# Patient Record
Sex: Female | Born: 1960 | Race: Black or African American | Hispanic: No | Marital: Married
Health system: Southern US, Community
[De-identification: ages and names within clinical notes are randomized; demographics above are authoritative.]

## PROBLEM LIST (undated history)

## (undated) DIAGNOSIS — Z8481 Family history of carrier of genetic disease: Secondary | ICD-10-CM

## (undated) DIAGNOSIS — Z803 Family history of malignant neoplasm of breast: Secondary | ICD-10-CM

## (undated) DIAGNOSIS — Z8041 Family history of malignant neoplasm of ovary: Secondary | ICD-10-CM

## (undated) DIAGNOSIS — Z8042 Family history of malignant neoplasm of prostate: Secondary | ICD-10-CM

## (undated) HISTORY — DX: Family history of carrier of genetic disease: Z84.81

## (undated) HISTORY — DX: Family history of malignant neoplasm of prostate: Z80.42

## (undated) HISTORY — DX: Family history of malignant neoplasm of breast: Z80.3

## (undated) HISTORY — DX: Family history of malignant neoplasm of ovary: Z80.41

---

## 1998-11-03 ENCOUNTER — Other Ambulatory Visit: Admission: RE | Admit: 1998-11-03 | Discharge: 1998-11-03 | Payer: Self-pay | Admitting: Obstetrics

## 1999-02-01 ENCOUNTER — Ambulatory Visit (HOSPITAL_COMMUNITY): Admission: RE | Admit: 1999-02-01 | Discharge: 1999-02-01 | Payer: Self-pay | Admitting: Obstetrics

## 1999-02-01 ENCOUNTER — Encounter: Payer: Self-pay | Admitting: Obstetrics

## 1999-10-29 ENCOUNTER — Ambulatory Visit (HOSPITAL_COMMUNITY): Admission: RE | Admit: 1999-10-29 | Discharge: 1999-10-29 | Payer: Self-pay | Admitting: Emergency Medicine

## 1999-10-29 ENCOUNTER — Emergency Department (HOSPITAL_COMMUNITY): Admission: EM | Admit: 1999-10-29 | Discharge: 1999-10-29 | Payer: Self-pay | Admitting: Emergency Medicine

## 1999-11-22 ENCOUNTER — Encounter: Payer: Self-pay | Admitting: General Surgery

## 1999-11-26 ENCOUNTER — Ambulatory Visit (HOSPITAL_COMMUNITY): Admission: RE | Admit: 1999-11-26 | Discharge: 1999-11-27 | Payer: Self-pay | Admitting: General Surgery

## 1999-11-26 ENCOUNTER — Encounter: Payer: Self-pay | Admitting: General Surgery

## 1999-11-26 ENCOUNTER — Encounter (INDEPENDENT_AMBULATORY_CARE_PROVIDER_SITE_OTHER): Payer: Self-pay | Admitting: Specialist

## 2000-02-21 ENCOUNTER — Other Ambulatory Visit: Admission: RE | Admit: 2000-02-21 | Discharge: 2000-02-21 | Payer: Self-pay | Admitting: Obstetrics

## 2002-07-20 ENCOUNTER — Encounter: Payer: Self-pay | Admitting: Obstetrics

## 2002-07-20 ENCOUNTER — Ambulatory Visit (HOSPITAL_COMMUNITY): Admission: RE | Admit: 2002-07-20 | Discharge: 2002-07-20 | Payer: Self-pay | Admitting: Obstetrics

## 2003-02-24 ENCOUNTER — Ambulatory Visit (HOSPITAL_COMMUNITY): Admission: RE | Admit: 2003-02-24 | Discharge: 2003-02-24 | Payer: Self-pay | Admitting: Obstetrics

## 2003-10-15 ENCOUNTER — Emergency Department (HOSPITAL_COMMUNITY): Admission: EM | Admit: 2003-10-15 | Discharge: 2003-10-15 | Payer: Self-pay | Admitting: Emergency Medicine

## 2004-02-07 ENCOUNTER — Emergency Department (HOSPITAL_COMMUNITY): Admission: EM | Admit: 2004-02-07 | Discharge: 2004-02-07 | Payer: Self-pay | Admitting: Emergency Medicine

## 2004-02-20 ENCOUNTER — Encounter: Admission: RE | Admit: 2004-02-20 | Discharge: 2004-02-20 | Payer: Self-pay | Admitting: Orthopedic Surgery

## 2004-02-24 ENCOUNTER — Ambulatory Visit (HOSPITAL_COMMUNITY): Admission: RE | Admit: 2004-02-24 | Discharge: 2004-02-24 | Payer: Self-pay | Admitting: Obstetrics

## 2005-08-11 IMAGING — DX DG ORTHOPANTOGRAM /PANORAMIC
1 series · 1 of 1 positions shown · non-contrast
Comparison: none

CLINICAL DATA: Left temporomandibular joint pain and popping.  
 ORTHOPANTOGRAM:
 There is periapical lucency around the roots of tooth #30.  The patient does have evidence of prior root canals at that tooth.  
 The mandibular condyles appear normal bilaterally.

[view not recorded]
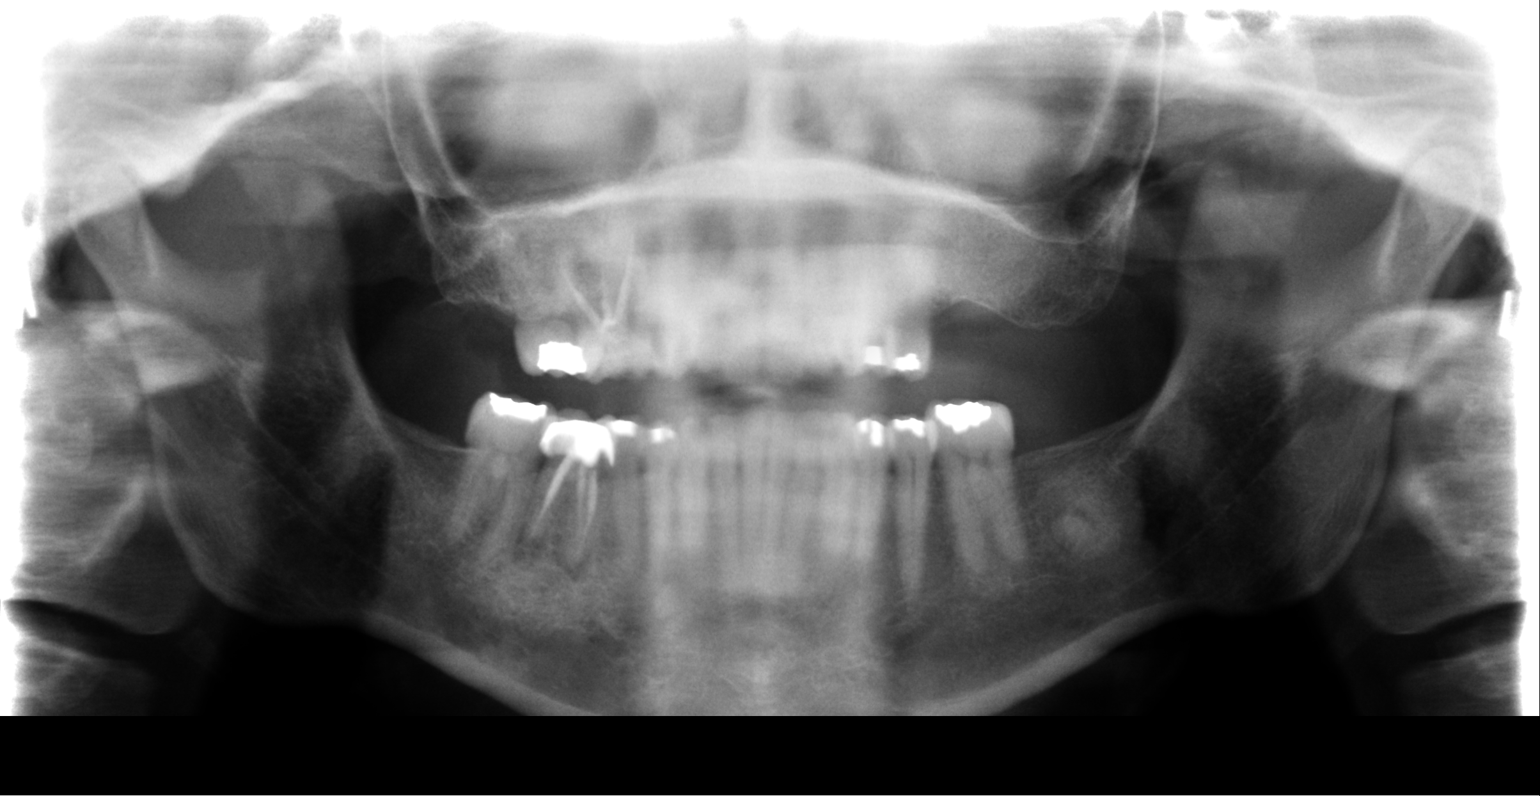

[1 of 1 positions shown; findings below may reference images not displayed]

IMPRESSION: Periapical lucencies around the roots of tooth #30.  Normal mandibular condyles.

## 2005-12-05 ENCOUNTER — Ambulatory Visit (HOSPITAL_COMMUNITY): Admission: RE | Admit: 2005-12-05 | Discharge: 2005-12-05 | Payer: Self-pay | Admitting: Obstetrics

## 2006-01-09 ENCOUNTER — Ambulatory Visit (HOSPITAL_COMMUNITY): Admission: RE | Admit: 2006-01-09 | Discharge: 2006-01-09 | Payer: Self-pay | Admitting: Obstetrics

## 2006-01-09 ENCOUNTER — Encounter (INDEPENDENT_AMBULATORY_CARE_PROVIDER_SITE_OTHER): Payer: Self-pay | Admitting: *Deleted

## 2007-04-22 ENCOUNTER — Emergency Department (HOSPITAL_COMMUNITY): Admission: EM | Admit: 2007-04-22 | Discharge: 2007-04-22 | Payer: Self-pay | Admitting: Emergency Medicine

## 2008-06-01 ENCOUNTER — Ambulatory Visit (HOSPITAL_COMMUNITY): Admission: RE | Admit: 2008-06-01 | Discharge: 2008-06-01 | Payer: Self-pay | Admitting: Family Medicine

## 2010-02-17 ENCOUNTER — Encounter: Payer: Self-pay | Admitting: Obstetrics

## 2010-06-15 NOTE — Op Note (Signed)
[name], Rebecca Ho                 ACCOUNT NO.:  [id number]   MEDICAL RECORD NO.:  [id number]          PATIENT TYPE:  AMB   LOCATION:  SDC                           FACILITY:  [facility]   PHYSICIAN:  Kathreen Cosier, M.D.DATE OF BIRTH:  Feb 06, 1960   DATE OF PROCEDURE:  01/09/2006  DATE OF DISCHARGE:                               OPERATIVE REPORT   PREOPERATIVE DIAGNOSIS:  Dysfunctional bleeding.   PROCEDURE:  Dilatation and curettage, hysteroscopy, and NovaSure  ablation.   Under general anesthesia, the patient in lithotomy position, the  perineum and vagina were prepped and draped, the bladder emptied with a  straight catheter.  A bimanual exam revealed the uterus to be normal in  size, negative adnexa.  A speculum placed in the vagina.  Cervix  injected with 10 mL of 1% Xylocaine.  Anterior lip of the cervix grasped  with a tenaculum and the cervix curetted and a small amount of tissue  obtained.  The endometrial cavity sounded to 10 cm.  Using a Hegar  dilator, the cervix was measured at 5 cm.  The cervix dilated with a #27  Shawnie Pons.  The hysteroscope was inserted and the cavity was noted to be  normal.  It was then removed and a sharp curettage performed and a  moderate amount of tissue obtained.  The NovaSure device was later  inserted and the cavity width was 4.5 cm.  The cavity ablation test was  passed and then the cavity was ablated at 124 watts for 1 minute 5  seconds.  Repeat hysteroscopy showed total ablation.  Fluid deficit 5  mL.  The patient tolerated the procedure well, taken to the recovery  room in good condition.           ______________________________  Kathreen Cosier, M.D.     BAM/MEDQ  D:  01/09/2006  T:  01/09/2006  Job:  [id number]

## 2010-06-15 NOTE — Op Note (Signed)
NAMEANIA, LEVAY                           ACCOUNT NO.:  [id number]   MEDICAL RECORD NO.:  [id number]                   PATIENT TYPE:  AMB   LOCATION:  SDC                                  FACILITY:  [facility]   PHYSICIAN:  Kathreen Cosier, M.D.           DATE OF BIRTH:  [birth year]   DATE OF PROCEDURE:  02/24/2003  DATE OF DISCHARGE:                                 OPERATIVE REPORT   PREOPERATIVE DIAGNOSIS:  Multiparity.   PROCEDURE:  Open laparoscopic tubal sterilization.   Under general anesthesia, patient in lithotomy position.  The abdomen,  perineum, and vagina were prepped and draped.  Bladder emptied with a  straight catheter.  A weighted speculum placed in the vagina.  A Hulka  tenaculum placed on the cervix.  In the umbilicus a transverse incision was  made and carried down to the fascia.  The fascia was cleaned.  Fascia  grasped with two Kochers and the fascia of the peritoneum opened with the  Mayo scissors.  The sleeve of the trocar inserted intraperitoneally.  Carbon  dioxide 3 L infused intraperitoneally.  The visual laparoscope inserted.  Uterus, tubes, and ovaries were normal.  The right tube was grasped 1 cm  from the cornu and cauterized.  The tube was cauterized in a total of four  places, moving lateral from the first site of cautery.  The procedure was  done in the exact fashion on the other side.  The probe was removed, the CO2  allowed to escape from the peritoneal cavity, and the fascia closed with one  stitch of 0 Dexon and the skin closed with subcuticular stitch of 3-0  Monocryl.  The patient tolerated the procedure well, was taken to the  recovery room in good condition.                                               Kathreen Cosier, M.D.    BAM/MEDQ  D:  02/24/2003  T:  02/24/2003  Job:  [id number]

## 2010-06-15 NOTE — Op Note (Signed)
[facility]  Patient:    Rebecca Ho, Rebecca Ho                          MRN: [id number] Adm. Date:  [id number] Attending:  Arlis Porta CC:         Kern Reap, M.D.   Operative Report  PREOPERATIVE DIAGNOSIS:       Chronic calculus, cholecystitis.  POSTOPERATIVE DIAGNOSIS:      Subacute calculus, cholecystitis.  PROCEDURE:                    Laparoscopic cholecystectomy with intraoperative cholangiogram.  SURGEON:                      Adolph Pollack, M.D.  ASSISTANT:                    Velora Heckler, M.D.  ANESTHESIA:                   General.  INDICATION:                   Ms. Cangemi is a 50 year old female who had a significant bout of right upper quadrant pain with nausea and vomiting.  She went to the emergency department where she improved, but was noticed to have a large gallstone in the gallbladder neck.  She did not have any jaundice. There is a significant family history of gallbladder disease.  Since that time she has been relatively asymptomatic.  She has watched her diet.  She now presents for elective cholecystectomy.  TECHNIQUE:                    She was placed upon the operating table and general anesthetic was administered.  Her abdomen was sterilely prepped and draped.  Local anesthetic consisting of plain Marcaine was infiltrated in the subumbilical region and longitudinal incision was made and carried down to the fascia where a 1.5 cm incision was made in the fascia.  A purse string suture of 0 Vicryl was placed around the fascial edges.  The peritoneal cavity was entered under direct vision.  A Hasson Trocar was introduced into the peritoneal cavity and pneumoperitoneum created by insufflation of CO2 gas. Next the laparoscope was introduced and the gallbladder was visualized and was a reddish color looking like it had maybe some acute and chronic inflammation. There were thin adhesions between the omentum and  duodenum in the gallbladder.                                An 11 mm Trocar was placed through a similar sized epigastric incision into the abdominal cavity and two 5 mm Trocars were placed in the right upper quadrant through similar sized incisions into the right upper quadrant.  The gallbladder was fairly tense and we had to decompress it and there appeared to be purulent material that was decompressed.  The fundus was then grasped and retracted toward the right shoulder.  I then carefully swept away the adhesions between the omentum, duodenum, and the gallbladder.  There was a very large stone in the neck of the gallbladder.  It made it difficult to identify the infundibulum.  I subsequently freed up the infundibulum leaving blunt dissection and the cautery.  Then identified a short cystic duct and traced  it all the way up to the gallbladder area and then skeletonized it.  I placed a clip across the gallbladder cystic duct junction and then made an incision into the cystic duct.  The ______ catheter was passed through the abdominal wall and placed into the cystic duct.  A cholangiogram was then performed.                                Under real time fluoroscopy contrast was injected into the cystic duct.  The common bile duct quickly opacified and contrast drained quickly into the duodenum.  The hepatic and right and left hepatic ducts were well seen.  There did not appear to be any obvious obstructions to the flow of contrast.  Final report is pending per the radiologist.                                The ______ catheter was removed.  The cystic duct was then clipped three times proximally and divided.  Cystic artery was identified, clipped, and divided.  The gallbladder was dissected free from the liver bed.  It was fairly indurated at this point.  There was also a lobulation of the liver that was adherent to the body of the gallbladder and this was included with the gallbladder  specimen.  Once the gallbladder was removed the liver bed was inspected and irrigated.  Bleeding points were controlled with the cautery.  No evidence of further bleeding or bile leakage were noted.                                The gallbladder was placed in an ______ bag and removed through the subumbilical port.  The subumbilical fascial incision was then closed by tightening up and tying down the purse string suture.  The perihepatic area was irrigated and once again no bleeding or bile leaking was noted.                                The remaining Trocars were removed and a pneumoperitoneum released.  The skin incisions were closed with 4-0 Monocryl subcuticular stitches followed by Steri-Strips and sterile dressings.                                She tolerated the procedure well without any apparent complications and was taken to the recovery room in satisfactory condition. DD:  11/26/99 TD:  11/26/99 Job: [id number]

## 2015-02-24 ENCOUNTER — Other Ambulatory Visit: Payer: Self-pay | Admitting: Family Medicine

## 2015-02-24 DIAGNOSIS — Z1231 Encounter for screening mammogram for malignant neoplasm of breast: Secondary | ICD-10-CM

## 2016-09-18 ENCOUNTER — Other Ambulatory Visit: Payer: Self-pay | Admitting: Family Medicine

## 2016-09-18 DIAGNOSIS — Z1231 Encounter for screening mammogram for malignant neoplasm of breast: Secondary | ICD-10-CM

## 2017-10-03 ENCOUNTER — Inpatient Hospital Stay: Payer: Managed Care, Other (non HMO) | Attending: Genetic Counselor | Admitting: Licensed Clinical Social Worker

## 2017-10-03 ENCOUNTER — Encounter: Payer: Self-pay | Admitting: Licensed Clinical Social Worker

## 2017-10-03 ENCOUNTER — Inpatient Hospital Stay: Payer: Managed Care, Other (non HMO)

## 2017-10-03 DIAGNOSIS — Z803 Family history of malignant neoplasm of breast: Secondary | ICD-10-CM

## 2017-10-03 DIAGNOSIS — Z8481 Family history of carrier of genetic disease: Secondary | ICD-10-CM

## 2017-10-03 DIAGNOSIS — Z8041 Family history of malignant neoplasm of ovary: Secondary | ICD-10-CM

## 2017-10-03 DIAGNOSIS — Z8042 Family history of malignant neoplasm of prostate: Secondary | ICD-10-CM

## 2017-10-03 NOTE — Progress Notes (Signed)
REFERRING PROVIDER: Self  PRIMARY PROVIDER:  Helane Rima, MD  PRIMARY REASON FOR VISIT:  1. Family history of BRCA2 gene positive   2. Family history of breast cancer   3. Family history of ovarian cancer   4. Family history of prostate cancer     HISTORY OF PRESENT ILLNESS:   Rebecca Ho, a self-referred 57 y.o. female, was seen for a [facility] cancer genetics consultation due to her sister's BRCA2+ test result.  Rebecca Ho presents to clinic today to discuss the possibility of a hereditary predisposition to cancer, genetic testing, and to further clarify her future cancer risks, as well as potential cancer risks for family members.   Rebecca Ho is a 57 y.o. female with no personal history of cancer.    HORMONAL RISK FACTORS:  Menarche was at age 14.  First live birth at age 61.  Ovaries intact: yes.  Hysterectomy: no.  Menopausal status: postmenopausal.  Colonoscopy: yes; normal. Mammogram within the last year: no. Number of breast biopsies: 0.  Past Medical History:  Diagnosis Date  . Family history of BRCA2 gene positive   . Family history of breast cancer   . Family history of ovarian cancer   . Family history of prostate cancer     Social History   Socioeconomic History  . Marital status: Married    Spouse name: Not on file  . Number of children: Not on file  . Years of education: Not on file  . Highest education level: Not on file  Occupational History  . Not on file  Social Needs  . Financial resource strain: Not on file  . Food insecurity:    Worry: Not on file    Inability: Not on file  . Transportation needs:    Medical: Not on file    Non-medical: Not on file  Tobacco Use  . Smoking status: Not on file  Substance and Sexual Activity  . Alcohol use: Not on file  . Drug use: Not on file  . Sexual activity: Not on file  Lifestyle  . Physical activity:    Days per week: Not on file    Minutes per session: Not on file  . Stress: Not on file   Relationships  . Social connections:    Talks on phone: Not on file    Gets together: Not on file    Attends religious service: Not on file    Active member of club or organization: Not on file    Attends meetings of clubs or organizations: Not on file    Relationship status: Not on file  Other Topics Concern  . Not on file  Social History Narrative  . Not on file     FAMILY HISTORY:  We obtained a detailed, 4-generation family history.  Significant diagnoses are listed below: Family History  Problem Relation Age of Onset  . Aneurysm Mother   . Ovarian cancer Sister 66       BRCA2+  . Breast cancer Maternal Aunt        dx 34s  . Aneurysm Maternal Grandmother   . Prostate cancer Maternal Grandfather        dx 72s  . Aneurysm Sister 19  . Cancer Maternal Aunt        possibly cervical, possibly breast, died at 39  . Prostate cancer Maternal Uncle 23  . Ovarian cancer Cousin        maternal cousin, dx 19s, in [place]   Ms.  Ho has a daughter, 23, and a son, 63. She has three sisters. One died of an aneurysm at 29 and has four children. Another sister is 28 with three children. Her sister Rebecca Ho was recently diagnosed with ovarian cancer and had genetic testing which identified a BRCA2 mutation. Rebecca Ho has three children, two daughters and a son.  Rebecca Ho father was adopted, she has no information about his family history. He died at 27.  Rebecca Ho mother died of an aneurysm at 1. She had two brothers and two sisters. One of Rebecca Ho's aunts had breast cancer and died in her 64s. The other aunt had either cervical or breast cancer and died around 97. Her daughter Rebecca Ho has ovarian cancer, recently diagnosed in her 39s. One of Rebecca Ho's uncles had prostate cancer diagnosed in his 70's and died in his 89's. Rebecca Ho maternal grandfather also had prostate cancer, diagnosed in his 70's and died in his 51's. Rebecca Ho grandmother died of an aneurysm at unknown  age.  Rebecca Ho is aware of previous family history of genetic testing for hereditary cancer risks. Patient's maternal ancestors are of [place] American and Native American descent, and paternal ancestors are of African American descent. There is no reported Ashkenazi Jewish ancestry. There is no known consanguinity.   GENETIC COUNSELING ASSESSMENT: Rebecca Ho is a 57 y.o. female with a family history of Hereditary Breast and Ovarian Cancer. We, therefore, discussed and recommended the following at today's visit.   DISCUSSION: We discussed that about 5-10% of breast cancer cases are hereditary with most cases due to BRCA mutations. We discussed BRCA2 in detail, given her sister's result. Other genes associated with hereditary breast cancer cases include ATM, CHEK2 and PALB2.  We reviewed the characteristics, features and inheritance patterns of hereditary cancer syndromes. We also discussed genetic testing, including the appropriate family members to test, the process of testing, insurance coverage and turn-around-time for results. We discussed the implications of a negative, positive and/or variant of uncertain significant result. We recommended Rebecca Ho pursue genetic testing for the Common Hereditary Cancers Panel.  The Common Hereditary Cancers Panel offered by [facility] includes sequencing and/or deletion duplication testing of the following 47 genes: APC, ATM, AXIN2, BARD1, BMPR1A, BRCA1, BRCA2, BRIP1, CDH1, CDKN2A (p14ARF), CDKN2A (p16INK4a), CKD4, CHEK2, CTNNA1, DICER1, EPCAM (Deletion/duplication testing only), GREM1 (promoter region deletion/duplication testing only), KIT, MEN1, MLH1, MSH2, MSH3, MSH6, MUTYH, NBN, NF1, NHTL1, PALB2, PDGFRA, PMS2, POLD1, POLE, PTEN, RAD50, RAD51C, RAD51D, SDHB, SDHC, SDHD, SMAD4, SMARCA4. STK11, TP53, TSC1, TSC2, and VHL.  The following genes were evaluated for sequence changes only: SDHA and HOXB13 c.251G>A variant only.  We discussed that if she is found to  have a mutation in one of these genes, it may impact future medical management recommendations such as increased cancer screenings and consideration of risk reducing surgeries.  A positive result could also have implications for the patient's family members.  A Negative result would mean we were unable to identify the BRCA2 mutation found in her family. While reassuring this does not rule out the possibility of a mutation. There could be mutations that are undetectable by current technology, or in genes not yet tested or identified to increase cancer risk.    We discussed the potential to find a Variant of Uncertain Significance or VUS.  These are variants that have not yet been identified as pathogenic or benign, and it is unknown if this variant is associated with increased cancer risk or if this  is a normal finding.  Most VUS's are reclassified to benign or likely benign.   It should not be used to make medical management decisions. With time, we suspect the lab will determine the significance of any VUS's identified if any.   Based on Rebecca Ho's family history of BRCA2+, she meets medical criteria for genetic testing. Despite that she meets criteria, she may still have an out of pocket cost. The lab will provide her with this cost.   PLAN: After considering the risks, benefits, and limitations, Rebecca Ho  provided informed consent to pursue genetic testing and the blood sample was sent to [facility] for analysis of the Common Hereditary Cancers Panel. Results should be available within approximately 2-3 weeks' time, at which point they will be disclosed by telephone to Rebecca Ho, as will any additional recommendations warranted by these results. Ms. Seidner will receive a summary of her genetic counseling visit and a copy of her results once available. This information will also be available in Epic.  Based on Ms. Ventress's family history, we recommended her other sister have genetic  counseling and testing. Ms. Plaia reported that her sister does not have health insurance. We let her know that the lab has patient assistance programs that could help with this. Ms. Allaire will let us know if we can be of any assistance in coordinating genetic counseling and/or testing for this family member. Ms. Foucher niece, Dolphus Jenny, is scheduled to see me next week.   Lastly, we encouraged Ms. Ortman to remain in contact with cancer genetics annually so that we can continuously update the family history and inform her of any changes in cancer genetics and testing that may be of benefit for this family.   Ms.  Coke questions were answered to her satisfaction today. Our contact information was provided should additional questions or concerns arise. Thank you for the referral and allowing [place] to share in the care of your patient.   Epimenio Foot, MS Genetic Counselor [email or web address] Phone: [phone]  The patient was seen for a total of 50 minutes in face-to-face genetic counseling.This patient was discussed with Drs. Magrinat, Lindi Adie and/or Burr Medico who agrees with the above.

## 2017-10-21 ENCOUNTER — Telehealth: Payer: Self-pay | Admitting: Licensed Clinical Social Worker

## 2017-10-21 ENCOUNTER — Encounter: Payer: Self-pay | Admitting: Licensed Clinical Social Worker

## 2017-10-21 DIAGNOSIS — Z1379 Encounter for other screening for genetic and chromosomal anomalies: Secondary | ICD-10-CM | POA: Insufficient documentation

## 2017-10-27 ENCOUNTER — Telehealth: Payer: Self-pay | Admitting: Licensed Clinical Social Worker

## 2017-10-28 ENCOUNTER — Encounter: Payer: Self-pay | Admitting: Licensed Clinical Social Worker

## 2017-10-28 ENCOUNTER — Ambulatory Visit: Payer: Self-pay | Admitting: Licensed Clinical Social Worker

## 2017-10-28 DIAGNOSIS — Z803 Family history of malignant neoplasm of breast: Secondary | ICD-10-CM

## 2017-10-28 DIAGNOSIS — Z8481 Family history of carrier of genetic disease: Secondary | ICD-10-CM

## 2017-10-28 DIAGNOSIS — Z8041 Family history of malignant neoplasm of ovary: Secondary | ICD-10-CM

## 2017-10-28 DIAGNOSIS — Z1379 Encounter for other screening for genetic and chromosomal anomalies: Secondary | ICD-10-CM

## 2017-10-28 DIAGNOSIS — Z8042 Family history of malignant neoplasm of prostate: Secondary | ICD-10-CM

## 2017-10-28 NOTE — Telephone Encounter (Signed)
Revealed negative genetic testing for the familial BRCA2 mutation.  Revealed that 3 VUS were identified. This normal result is very reassuring;   it is unlikely that there is an increased risk of cancer due to a mutation in one of these genes.  However, genetic testing is not perfect, and cannot definitively rule out a hereditary cause.  It will be important for her to keep in contact with genetics to learn if any additional testing may be needed in the future. She informed me that her niece would be rescheduling to see me soon for her genetic counseling/testing.

## 2017-10-28 NOTE — Progress Notes (Signed)
HPI:  Rebecca Ho was previously seen in the [place] clinic on 10/03/2017 due to her sister's recent genetic testing which revealed a BRCA2 mutation. Please refer to our prior cancer genetics clinic note for more information regarding Rebecca Ho's medical, social and Ho histories, and our assessment and recommendations, at the time. Rebecca Ho recent genetic test results were disclosed to her, as well as recommendations warranted by these results. These results and recommendations are discussed in more detail below.   Ho HISTORY:  We obtained a detailed, 4-generation Ho history.  Significant diagnoses are listed below: Ho History  Problem Relation Age of Onset  . Aneurysm Mother   . Ovarian cancer Sister 40       BRCA2+  . Breast cancer Maternal Ho        dx 41s  . Aneurysm Maternal Grandmother   . Prostate cancer Maternal Grandfather        dx 7s  . Aneurysm Sister 55  . Cancer Maternal Ho        possibly cervical, possibly breast, died at 69  . Prostate cancer Maternal Uncle 63  . Ovarian cancer Cousin        maternal cousin, dx 29s, in [place]   Rebecca Ho has a daughter, 42, and a son, 43. She has three sisters. One died of an aneurysm at 51 and has four children. Another sister is 77 with three children. Her sister Rebecca Ho was recently diagnosed with ovarian cancer and had genetic testing which identified a BRCA2 mutation. Rebecca Ho has three children, two daughters and a son.  Rebecca Ho father was adopted, she has no information about his Ho history. He died at 12.  Rebecca Ho mother died of an aneurysm at 71. She had two brothers and two sisters. One of Rebecca Ho's aunts had breast cancer and died in her 70s. The other Ho had either cervical or breast cancer and died around 30. Her daughter Rebecca Ho has ovarian cancer, recently diagnosed in her 57s. One of Rebecca Ho's uncles had prostate cancer diagnosed in his 48's and died in his  40's. Rebecca Ho maternal grandfather also had prostate cancer, diagnosed in his 63's and died in his 39's. Rebecca Ho grandmother died of an aneurysm at unknown age.  Rebecca Ho is aware of previous Ho history of genetic testing for hereditary cancer risks. Patient's maternal ancestors are of [place] American and Native American descent, and paternal ancestors are of African American descent. There is no reported Ashkenazi Jewish ancestry. There is no known consanguinity.  GENETIC TEST RESULTS: Genetic testing performed through [facility]'s Common Hereditary Cancers Panel reported out on 10/20/2017 showed no pathogenic mutations.   The Common Hereditary Cancers Panel offered by [facility] includes sequencing and/or deletion duplication testing of the following 47 genes: APC, ATM, AXIN2, BARD1, BMPR1A, BRCA1, BRCA2, BRIP1, CDH1, CDKN2A (p14ARF), CDKN2A (p16INK4a), CKD4, CHEK2, CTNNA1, DICER1, EPCAM (Deletion/duplication testing only), GREM1 (promoter region deletion/duplication testing only), KIT, MEN1, MLH1, MSH2, MSH3, MSH6, MUTYH, NBN, NF1, NHTL1, PALB2, PDGFRA, PMS2, POLD1, POLE, PTEN, RAD50, RAD51C, RAD51D, SDHB, SDHC, SDHD, SMAD4, SMARCA4. STK11, TP53, TSC1, TSC2, and VHL.  The following genes were evaluated for sequence changes only: SDHA and HOXB13 c.251G>A variant only.  A variant of uncertain significance (VUS) in a gene called KIT c.1594G>A was also noted.  A variant of uncertain significance (VUS) in a gene called PALB2 c.1655A>G was also noted.  A variant of uncertain significance (VUS) in a gene called PMS2 c.591C>T was also  noted.   The test report will be scanned into EPIC and will be located under the Molecular Pathology section of the Results Review tab. A portion of the result report is included below for reference.      We recommended Rebecca Ho pursue testing for the familial hereditary cancer gene mutation called BRCA2 [phone]. Rebecca Ho test was normal and did not  reveal the familial mutation. We call this result a true negative result because the cancer-causing mutation was identified in Rebecca Ho, and she did not inherit it.  Given this negative result, Rebecca Ho's chances of developing BRCA2-related cancers are the same as they are in the general population.     Regarding the VUS's in KIT, PALB2, and PMS2: At this time, it is unknown if these variants are associated with increased cancer risk or if they are normal findings, but most variants such as these get reclassified to being inconsequential. They should not be used to make medical management decisions. With time, we suspect the lab will determine the significance of these variants, if any. If we do learn more about them, we will try to contact Rebecca Ho to discuss it further. However, it is important to stay in touch with [place] periodically and keep the address and phone number up to date.  ADDITIONAL GENETIC TESTING: While Rebecca Ho has had fairly extensive genetic testing, we discussed that there are other genes that are associated with increased cancer risk that can be analyzed. The laboratories that offer this testing look at these additional genes via a hereditary cancer gene panel. Should Rebecca Ho wish to pursue additional genetic testing, we are happy to discuss and coordinate this testing, at any time.    CANCER SCREENING RECOMMENDATIONS:  Rebecca Ho test result is considered negative (normal).  This means that we have not identified the familial BRCA2 mutation in her.   This normal indicates that it is unlikely Rebecca Ho has an increased risk of cancer due to a mutation in one of these genes.  Therefore, Rebecca Ho was advised to continue following the cancer screening guidelines provided by her primary healthcare providers. Other factors such as her personal and Ho history may still affect her cancer risk.    RECOMMENDATIONS FOR Ho MEMBERS:  Relatives in this Ho might  be at some increased risk of developing cancer, over the general population risk, simply due to the Ho history of cancer.  We recommended women in this Ho have a yearly mammogram beginning at age 50, or 76 years younger than the earliest onset of cancer, an annual clinical breast exam, and perform monthly breast self-exams. Women in this Ho should also have a gynecological exam as recommended by their primary provider. All Ho members should have a colonoscopy by age 21 (or as directed by their doctors).  All Ho members should inform their physicians about the Ho history of cancer so their doctors can make the most appropriate screening recommendations for them.   We recommended her other sister, nieces and nephews of her deceased sister, and her cousins have genetic counseling and testing. Ms. Harbison will let us know if we can be of any assistance in coordinating genetic counseling and/or testing for these Ho members.   FOLLOW-UP: Lastly, we discussed with Ms. Ellingwood that cancer genetics is a rapidly advancing field and it is possible that new genetic tests will be appropriate for her and/or her Ho members in the future. We encouraged her to remain in contact with  cancer genetics on an annual basis so we can update her personal and Ho histories and let her know of advances in cancer genetics that may benefit this Ho.   Our contact number was provided. Ms. Dark questions were answered to her satisfaction, and she knows she is welcome to call us at anytime with additional questions or concerns.   Epimenio Foot, MS Genetic Counselor [email or web address] Phone: [phone]

## 2017-11-20 NOTE — Telephone Encounter (Signed)
Error

## 2019-12-31 ENCOUNTER — Ambulatory Visit: Payer: Managed Care, Other (non HMO) | Attending: Internal Medicine

## 2019-12-31 DIAGNOSIS — Z23 Encounter for immunization: Secondary | ICD-10-CM

## 2019-12-31 NOTE — Progress Notes (Signed)
   Covid-19 Vaccination Clinic  Name:  Rebecca Ho    MRN: [id number] DOB: [birth year]  12/31/2019  [name] was observed post Covid-19 immunization for 15 minutes without incident. She was provided with Vaccine Information Sheet and instruction to access the V-Safe system.   [name] was instructed to call 911 with any severe reactions post vaccine: Marland Kitchen Difficulty breathing  . Swelling of face and throat  . A fast heartbeat  . A bad rash all over body  . Dizziness and weakness   Immunizations Administered    Name Date Dose VIS Date Route   Pfizer COVID-19 Vaccine 12/31/2019  5:18 PM 0.3 mL 11/17/2019 Intramuscular   Manufacturer: [employer], [employer]   Lot: [id number]   NDC: [address]-7

## 2021-10-18 ENCOUNTER — Other Ambulatory Visit: Payer: Self-pay

## 2021-10-18 ENCOUNTER — Emergency Department (HOSPITAL_COMMUNITY): Payer: Managed Care, Other (non HMO)

## 2021-10-18 ENCOUNTER — Emergency Department (HOSPITAL_COMMUNITY)
Admission: EM | Admit: 2021-10-18 | Discharge: 2021-10-18 | Disposition: A | Discharge status: Home / Self Care | Payer: No Typology Code available for payment source | Attending: Emergency Medicine | Admitting: Emergency Medicine

## 2021-10-18 ENCOUNTER — Encounter (HOSPITAL_COMMUNITY): Payer: Self-pay

## 2021-10-18 DIAGNOSIS — S0990XA Unspecified injury of head, initial encounter: Secondary | ICD-10-CM | POA: Diagnosis present

## 2021-10-18 DIAGNOSIS — W01198A Fall on same level from slipping, tripping and stumbling with subsequent striking against other object, initial encounter: Secondary | ICD-10-CM | POA: Diagnosis not present

## 2021-10-18 DIAGNOSIS — W19XXXA Unspecified fall, initial encounter: Secondary | ICD-10-CM

## 2021-10-18 DIAGNOSIS — Y990 Civilian activity done for income or pay: Secondary | ICD-10-CM | POA: Diagnosis not present

## 2021-10-18 MED ORDER — IBUPROFEN 200 MG PO TABS
600.0000 mg | ORAL_TABLET | Freq: Once | ORAL | Status: AC
  Administered 2021-10-18: 600 mg via ORAL
  Filled 2021-10-18: qty 3

## 2021-10-18 MED ORDER — IBUPROFEN 200 MG PO TABS
ORAL_TABLET | ORAL | Status: AC
  Filled 2021-10-18: qty 1

## 2021-10-18 MED ORDER — METHOCARBAMOL 500 MG PO TABS: 500.0000 mg | ORAL_TABLET | Freq: Two times a day (BID) | ORAL | 0 refills | Status: AC

## 2021-10-18 NOTE — ED Provider Notes (Signed)
[facility] DEPT Provider Note   CSN: [id number] Arrival date & time: 10/18/21  1402     History  Chief Complaint  Patient presents with   Head Injury   Rebecca Ho is a 61 y.o. female.  61 year old female presents today for evaluation of head injury that occurred around noon today while patient was at work.  Patient states she was sitting on a chair when the chair gave out underneath her and she hit the back of her head on the edge of a desk.  Denies loss of consciousness.  Is not on anticoagulation.  Denies any other injury.  Has been ambulatory since the time of the incident.   The history is provided by the patient. No language interpreter was used.       Home Medications Prior to Admission medications   Not on File      Allergies    Patient has no known allergies.    Review of Systems   Review of Systems  Constitutional:  Negative for chills and fever.  Respiratory:  Negative for shortness of breath.   Cardiovascular:  Negative for chest pain and palpitations.  Neurological:  Positive for headaches. Negative for syncope and weakness.  All other systems reviewed and are negative.   Physical Exam Updated Vital Signs BP (!) 160/100 (BP Location: Left Arm)   Pulse 79   Temp 98 F (36.7 C) (Oral)   Resp 16   SpO2 99%  Physical Exam Vitals and nursing note reviewed.  Constitutional:      General: She is not in acute distress.    Appearance: Normal appearance. She is not ill-appearing.  HENT:     Head: Normocephalic and atraumatic.     Nose: Nose normal.  Eyes:     Conjunctiva/sclera: Conjunctivae normal.  Pulmonary:     Effort: Pulmonary effort is normal. No respiratory distress.  Musculoskeletal:        General: No deformity.     Comments: Full range of motion bilateral upper and lower extremities.  All major joints without tenderness to palpation.  Ambulatory without difficulty.  Cervical, thoracic and lumbar  spine without tenderness to palpation.  Skin:    Findings: No rash.  Neurological:     Mental Status: She is alert.     ED Results / Procedures / Treatments   Labs (all labs ordered are listed, but only abnormal results are displayed) Labs Reviewed - No data to display  EKG None  Radiology CT Head Wo Contrast  Result Date: 10/18/2021 CLINICAL DATA:  Head and neck trauma, fall at work EXAM: CT HEAD WITHOUT CONTRAST CT CERVICAL SPINE WITHOUT CONTRAST TECHNIQUE: Multidetector CT imaging of the head and cervical spine was performed following the standard protocol without intravenous contrast. Multiplanar CT image reconstructions of the cervical spine were also generated. RADIATION DOSE REDUCTION: This exam was performed according to the departmental dose-optimization program which includes automated exposure control, adjustment of the mA and/or kV according to patient size and/or use of iterative reconstruction technique. COMPARISON:  None Available. FINDINGS: CT HEAD FINDINGS Brain: No evidence of acute infarction, hemorrhage, hydrocephalus, extra-axial collection or mass lesion/mass effect. Vascular: No hyperdense vessel or unexpected calcification. Skull: Hyperostosis.  Negative for fracture or focal lesion. Sinuses/Orbits: No acute finding. Other: None. CT CERVICAL SPINE FINDINGS Alignment: Straightening of the cervical spine. Skull base and vertebrae: No acute fracture. No primary bone lesion or focal pathologic process. Soft tissues and spinal canal:  No prevertebral fluid or swelling. No visible canal hematoma. Disc levels: Degenerate disc disease at C5-C6 with disc height loss and marginal osteophytes. Mild uncovertebral joint arthropathy and mild bilateral neural foraminal stenosis. Upper chest: Negative. Other: None IMPRESSION: CT head: No acute intracranial abnormality. CT cervical spine: 1. No acute fracture or traumatic subluxation. 2. 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 3. No appreciable injury of the paraspinal soft tissues. Electronically Signed   By: Larose Hires D.O.   On: 10/18/2021 16:23   CT Cervical Spine Wo Contrast  Result Date: 10/18/2021 CLINICAL DATA:  Head and neck trauma, fall at work EXAM: CT HEAD WITHOUT CONTRAST CT CERVICAL SPINE WITHOUT CONTRAST TECHNIQUE: Multidetector CT imaging of the head and cervical spine was performed following the standard protocol without intravenous contrast. Multiplanar CT image reconstructions of the cervical spine were also generated. RADIATION DOSE REDUCTION: This exam was performed according to the departmental dose-optimization program which includes automated exposure control, adjustment of the mA and/or kV according to patient size and/or use of iterative reconstruction technique. COMPARISON:  None Available. FINDINGS: CT HEAD FINDINGS Brain: No evidence of acute infarction, hemorrhage, hydrocephalus, extra-axial collection or mass lesion/mass effect. Vascular: No hyperdense vessel or unexpected calcification. Skull: Hyperostosis.  Negative for fracture or focal lesion. Sinuses/Orbits: No acute finding. Other: None. CT CERVICAL SPINE FINDINGS Alignment: Straightening of the cervical spine. Skull base and vertebrae: No acute fracture. No primary bone lesion or focal pathologic process. Soft tissues and spinal canal: No prevertebral fluid or swelling. No visible canal hematoma. Disc levels: Degenerate disc disease at C5-C6 with disc height loss and marginal osteophytes. Mild uncovertebral joint arthropathy and mild bilateral neural foraminal stenosis. Upper chest: Negative. Other: None IMPRESSION: CT head: No acute intracranial abnormality. CT cervical spine: 1. No acute fracture or traumatic subluxation. 2. 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 3. No appreciable injury of the paraspinal  soft tissues. Electronically Signed   By: Larose Hires D.O.   On: 10/18/2021 16:23    Procedures Procedures    Medications Ordered in ED Medications - No data to display  ED Course/ Medical Decision Making/ A&P                           Medical Decision Making  61 year old female presents today for evaluation of head injury that occurred at work around noon today.  Denies loss of consciousness.  No syncopal episode.  No prodromal symptoms.  She states she sat on a chair which gave out underneath her causing her to fall backwards hitting her head on the edge of the desk.  No injury noted over the scalp.  Without spinal process tenderness.  Likely muscle strain.  Patient is overall well-appearing.  Patient is appropriate for discharge.  Robaxin prescribed.  Return precautions discussed.  Patient voices understanding and is in agreement with plan.   Final Clinical Impression(s) / ED Diagnoses Final diagnoses:  Injury of head, initial encounter  Fall, initial encounter    Rx / DC Orders ED Discharge Orders          Ordered    methocarbamol (ROBAXIN) 500 MG tablet  2 times daily        10/18/21 1948              Marita Kansas, PA-C 10/18/21 1950    Sloan Leiter, DO  10/22/21 0326  

## 2021-10-18 NOTE — Discharge Instructions (Signed)
Your exam today was overall reassuring.  CT scan of the head and neck did not show any concerning findings such as bleeding, or neck fracture.  Today sprain surrounding the neck.  You may notice that this becomes worse over the next day or 2.  This is typical following this type of injury.  I have sent muscle relaxers to the pharmacy for you to use as needed.  This can make you drowsy so do not take this when you are at work, or driving.  If need be just reserve this for bedtime.  Otherwise take Tylenol and Motrin as you need to for pain control.  For any concerning symptoms return to the emergency room.

## 2021-10-18 NOTE — ED Provider Triage Note (Signed)
Emergency Medicine Provider Triage Evaluation Note  Rebecca Ho , a 61 y.o. female  was evaluated in triage.  Pt complains of fall at work. She reports that she was sitting in chair when the leg broke off and she fell backwards hitting her head and neck on the desk. This was around 1200 today. Denies any LOC or blood thinner use. Did not take BP meds today. Denies any blurry vision, nausea, or vomiting. 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   Review of Systems  Positive:  Negative:   Physical Exam  BP (!) 170/116 (BP Location: Left Arm)   Pulse 72   Temp 98 F (36.7 C) (Oral)   Resp 18   SpO2 98%  Gen:   Awake, no distress   Resp:  Normal effort  MSK:   Moves extremities without difficulty  Other:  Appropriate speech. Cranial nerves grossly intact. No pronator drift.   Medical Decision Making  Medically screening exam initiated at 3:35 PM.  Appropriate orders placed.  Rebecca Ho was informed that the remainder of the evaluation will be completed by another provider, this initial triage assessment does not replace that evaluation, and the importance of remaining in the ED until their evaluation is complete.  Will order CT and CT neck.    Rebecca Ho, [place] 10/18/21 1537

## 2021-10-18 NOTE — ED Triage Notes (Addendum)
Pt states she fell at work, was sitting in a chair, hit her head. Pt denies LOC, denies taking blood thinners. Pt ambulatory. Pt c/o headache and back pain. Pt has HTN and states she forgot to take her BP meds today, last taken yesterday.
